# Patient Record
Sex: Female | Born: 1970 | Hispanic: No | Marital: Married | Smoking: Never smoker
Health system: Southern US, Community
[De-identification: ages and names within clinical notes are randomized; demographics above are authoritative.]

## PROBLEM LIST (undated history)

## (undated) DIAGNOSIS — E039 Hypothyroidism, unspecified: Secondary | ICD-10-CM

## (undated) HISTORY — PX: BACK SURGERY: SHX140

## (undated) HISTORY — DX: Hypothyroidism, unspecified: E03.9

## (undated) HISTORY — PX: HERNIA REPAIR: SHX51

---

## 1999-11-27 ENCOUNTER — Inpatient Hospital Stay (HOSPITAL_COMMUNITY): Admission: RE | Admit: 1999-11-27 | Discharge: 1999-11-29 | Payer: Self-pay | Admitting: Orthopedic Surgery

## 1999-11-27 ENCOUNTER — Encounter: Payer: Self-pay | Admitting: Orthopedic Surgery

## 2005-08-28 ENCOUNTER — Encounter: Admission: RE | Admit: 2005-08-28 | Discharge: 2005-08-28 | Payer: Self-pay | Admitting: Otolaryngology

## 2008-07-05 ENCOUNTER — Ambulatory Visit: Payer: Self-pay | Admitting: Family Medicine

## 2008-07-05 DIAGNOSIS — E039 Hypothyroidism, unspecified: Secondary | ICD-10-CM | POA: Insufficient documentation

## 2008-07-05 LAB — CONVERTED CEMR LAB
Inflenza A Ag: NEGATIVE
Influenza B Ag: NEGATIVE
Rapid Strep: NEGATIVE

## 2011-02-07 NOTE — Op Note (Signed)
[place]  Patient:    Morgan Henderson, Morgan Henderson                        MRN: [id number] Proc. Date: 11/27/99 Adm. Date:  [id number] Attending:  Drema Pry CC:         Jearld Adjutant, M.D.                           Operative Report  PREOPERATIVE DIAGNOSIS:  Herniated nucleus pulposus L5-S1 left with extruded fragment and disk bulge right.  POSTOPERATIVE DIAGNOSIS:  Herniated nucleus pulposus L5-S1 left with extruded fragment and disk bulge right.  PROCEDURE:  Hemilaminotomy L5-S1 bilateral left and right with disk excision, S1 nerve root decompression and excision of extruded fragment left.  SURGEON:  Jearld Adjutant, M.D.  ASSISTANTClide Cliff D. Gasper Sells, M.D.  ANESTHESIA:  General endotracheal.  CULTURES:  None.  DRAINS:  None.  ESTIMATED BLOOD LOSS:  200 cc.  REPLACEMENT:  Without.  PATHOLOGIC FINDINGS/HISTORY:  Morgan Henderson is a 40 year old female, who first presented to [place] July 03, 1999 with a one year history of on and off back pain.  It has  bothered her with the beginning of the year with a lot of lifting.  She also has two young children, who like to be carried a lot.  The pain initially radiated own her right leg toward her calf.  She has seen a chiropractor, who felt she might  have a disk bulge.  We evaluated her and felt there was some disk space narrowing at L5-S1, but first thought she had lumbar HNP centrally and to the right.  We obtained a MRI scan at that point and after treating her with a Sterapred dose ack and what it showed was a central degenerative disk bulge at 4-5 and 5-1 with a large central HNP at L5-S1 extending centrally and to the right improving clinically.  Ultimately, the patient calmed down her pain.  We got her ______ support and she was 90% improved overall.  She came back November 18, 1999 with  severe low-back pain, brought her to her knees.  The pain was radiating down her left leg into her  calf and ankles, severe to the point, where she could hardly walk.  She denied bladder or bowel dysfunction.  We examined her and we thought she had an acute herniated disk with mild left EHL weakness.  Straight leg raising positive on the right at 70, left 45.  We put her on another dose pack, got another MRI scan.  At this point, when she was medicated, she was feeling 5-7 level pain, mostly left leg pain, some right.  MRI scan showed a large extruded disk fragment L5-S1 down the left side of the body extended in the neural foramina, as well as, a mild central disk bulge at 4-5.  There was also some bulge on the right side at  L5-S1.  We felt at this point, given her overall history, she deserved a bilateral decompression with removal of the large fragment on the left.  At surgery, we did indeed find this huge, large fragment in two large chunks, which we removed. These chunks measured at least 2 x 1 x 1 cm the large chunk and the other 1 x 1 cm. n the right side, there was not as much disk material, but there was one 1 x 1 fragment that we  removed from that side.  The disk space was relatively clear.  There was some endplate that we scraped and removed.  Foraminotomies were carried out and the nerve roots were clear at closure.  We had a small 5 mm bleb on the  nerve root partial dural rent, but under direct vision with loupe magnification, it did not leak on several Valsalva maneuvers.  Therefore, we felt it was partial thickness and we did not repair it, but did put Surgicel on it with fibrin glue and I think this will not be a problem.  Her right side was completely decompressed, as well as, the left.  PROCEDURE:  With adequate anesthesia obtained using endotracheal technique, 1 g  Ancef given IV prophylaxis.  The patient was placed in the prone position on chest rolls in a flexed position.  We then placed a localizing spinal needle after sterile prep at L5-S1.  X-ray  were taken confirming positioning.  Standard prepping and draping was then carried out and an incision made from L5 to S1.  The incision was deepened sharply with the knife and hemostasis obtained using the Bovie electrocoagulator.   Loupe magnification and illumination was used with 3.5 loupe. Dissection was carried down to the lumbar fascia.  Bleeding points were cauterized with the Bovie and retractors placed.  I then incised the lumbar fascia at L5-S1 on both sides of the spinous processes and removed or spread the musculature off the lamina.  Deep retractors were then placed and further soft tissues were dissected. Then on the left side, I entered with superior laminotomy with a #4 Penfield. Placed the Eldora 4 in place, got and x-ray, localizing [place] to L5-S1.  We first started on the right side hemilaminotomy, where a laminotomy was carried out with Kerrison punch after thinning the lamina with the bur and removal of the ligamentum flavum and exposure of the dura and nerve root.  The nerve root was moved medially with a nerve root retractor.  We then spread over the disk space, incised the disk space and removed disk with pituitaries and angled curets and Epstein down curets. When we were satisfied with decompression on that side, we turned to the left side, did the exact same laminectomy and ligamentum flavum excision.  We first then saw the extruded disk fragment, teased the nerve root over and then teased the main  portion of the largest fragment out and then a second fragment.  We then felt above and below the nerve root, to make sure we did not push any more disk material down and there was none.  We retracted the nerve root, exposed the disk space and evacuated the disk in a similar fashion.  We then checked above and below with  hockey stick and underneath the disk space and there was no additional disk material.  We then noted the small bleb on the nerve root,  did the Valsalva maneuver several times to 30 cmH2O.  We then placed Surgicel and fibrin glue over top after I had placed 800 mg of fentanyl for 90 seconds on the nerve root.  Gelfoam was placed on the other side with a small amount of Surgicel and hemostasis was obtained in the hemilaminotomies bilaterally with the nerve roots freed on oth sides and the disk material removed.  We then placed 0.5% Marcaine in and about the lumbar without epinephrine wound and musculature.  Further irrigation was carried out and the wound was closed in layers with #1 Vicryl on  the lumbar fascia and -0 and 3-0 Vicryl on the subcu, and skin staples.  A sterile compressive dressing as applied and the patient, having tolerated the procedure well, was turned over to the recovery room stretch and taken to the recovery room in satisfactory condition for routine postoperative care. DD:  11/27/99 TD:  11/28/99 Job: [id number]

## 2011-07-08 ENCOUNTER — Encounter: Payer: Self-pay | Admitting: Emergency Medicine

## 2011-07-08 ENCOUNTER — Inpatient Hospital Stay (INDEPENDENT_AMBULATORY_CARE_PROVIDER_SITE_OTHER)
Admission: RE | Admit: 2011-07-08 | Discharge: 2011-07-08 | Disposition: A | Discharge status: Home / Self Care | Payer: BC Managed Care – PPO | Source: Ambulatory Visit | Attending: Emergency Medicine | Admitting: Emergency Medicine

## 2011-07-08 ENCOUNTER — Other Ambulatory Visit: Payer: Self-pay | Admitting: Emergency Medicine

## 2011-07-08 ENCOUNTER — Ambulatory Visit
Admission: RE | Admit: 2011-07-08 | Discharge: 2011-07-08 | Disposition: A | Discharge status: Home / Self Care | Payer: BC Managed Care – PPO | Source: Ambulatory Visit | Attending: Emergency Medicine | Admitting: Emergency Medicine

## 2011-07-08 DIAGNOSIS — M722 Plantar fascial fibromatosis: Secondary | ICD-10-CM | POA: Insufficient documentation

## 2011-07-08 DIAGNOSIS — R52 Pain, unspecified: Secondary | ICD-10-CM

## 2011-07-08 DIAGNOSIS — J309 Allergic rhinitis, unspecified: Secondary | ICD-10-CM

## 2011-07-10 ENCOUNTER — Ambulatory Visit: Payer: BC Managed Care – PPO | Attending: Emergency Medicine | Admitting: Physical Therapy

## 2011-07-10 DIAGNOSIS — M6281 Muscle weakness (generalized): Secondary | ICD-10-CM | POA: Insufficient documentation

## 2011-07-10 DIAGNOSIS — M25579 Pain in unspecified ankle and joints of unspecified foot: Secondary | ICD-10-CM | POA: Insufficient documentation

## 2011-07-10 DIAGNOSIS — IMO0001 Reserved for inherently not codable concepts without codable children: Secondary | ICD-10-CM | POA: Insufficient documentation

## 2011-07-14 ENCOUNTER — Ambulatory Visit: Payer: BC Managed Care – PPO | Admitting: Physical Therapy

## 2011-07-16 ENCOUNTER — Ambulatory Visit: Payer: BC Managed Care – PPO | Admitting: Physical Therapy

## 2011-07-18 ENCOUNTER — Ambulatory Visit: Payer: BC Managed Care – PPO | Admitting: Physical Therapy

## 2011-07-25 ENCOUNTER — Encounter: Payer: Self-pay | Admitting: Family Medicine

## 2011-07-25 ENCOUNTER — Ambulatory Visit (INDEPENDENT_AMBULATORY_CARE_PROVIDER_SITE_OTHER): Payer: BC Managed Care – PPO | Admitting: Family Medicine

## 2011-07-25 DIAGNOSIS — R269 Unspecified abnormalities of gait and mobility: Secondary | ICD-10-CM

## 2011-07-25 DIAGNOSIS — M79609 Pain in unspecified limb: Secondary | ICD-10-CM

## 2011-07-25 DIAGNOSIS — M722 Plantar fascial fibromatosis: Secondary | ICD-10-CM | POA: Insufficient documentation

## 2011-07-25 DIAGNOSIS — M2140 Flat foot [pes planus] (acquired), unspecified foot: Secondary | ICD-10-CM | POA: Insufficient documentation

## 2011-07-25 DIAGNOSIS — M79673 Pain in unspecified foot: Secondary | ICD-10-CM | POA: Insufficient documentation

## 2011-07-25 NOTE — Patient Instructions (Signed)
You have plantar fasciitis Take tylenol or aleve as needed for pain  Plantar fascia stretch for 20-30 seconds (do 3 of these) in morning Lowering/raise on a step exercises 3 x 15 once or twice a day - this is very important for long term recovery. Can add heel walks, toe walks forward and backward as well Ice bucket 10-15 minutes at end of day Avoid flat shoes/barefoot walking as much as possible. Arch straps have been shown to help with pain. Heel lifts may help with pain by avoiding fully stretching the plantar fascia except when doing home exercises. Orthotics may be helpful. Steroid injection is a consideration for short term pain relief if you are struggling. Physical therapy is also an option. >90% improve by 12 months with or without treatment but the above things improve the condition faster.

## 2011-07-25 NOTE — Progress Notes (Signed)
  Subjective:    Patient ID: Morgan Henderson, female    DOB: 06-11-1971, 40 y.o.   MRN: [id number]  PCP: Dr. Lissa Morales  HPI 40 yo F here for bilateral heel pain.  Patient has prior history of plantar fasciitis dating back to 5 years ago. It improved with cortisone injections, stretches but seems to have recurred over past month. Has been doing stretches, icing, massage, using ankle brace, physical therapy and iontophoresis. Has been recommended that she come here to try shoe inserts. Not done cortisone injections for this round of pain. Is plantar on right worse than left heels. No other pain No bruising or swelling No known injury or offending event. Tried different shoes - newest ones feel the best.  Past Medical History  Diagnosis Date  . Hypothyroidism     No current outpatient prescriptions on file prior to visit.    Past Surgical History  Procedure Date  . Back surgery   . Hernia repair     No Known Allergies  History   Social History  . Marital Status: Single    Spouse Name: N/A    Number of Children: N/A  . Years of Education: N/A   Occupational History  . Not on file.   Social History Main Topics  . Smoking status: Never Smoker   . Smokeless tobacco: Not on file  . Alcohol Use: Not on file  . Drug Use: Not on file  . Sexually Active: Not on file   Other Topics Concern  . Not on file   Social History Narrative  . No narrative on file    Family History  Problem Relation Age of Onset  . Hyperlipidemia Father   . Hypertension Father   . Heart attack Maternal Grandfather   . Heart attack Paternal Grandmother   . Sudden death Neg Hx   . Diabetes Neg Hx     BP 138/83  Pulse 67  Temp(Src) 97.8 F (36.6 C) (Oral)  Ht 5\' 4"  (1.626 m)  Wt 160 lb (72.576 kg)  BMI 27.46 kg/m2  LMP 06/24/2011  Review of Systems See HPI above.    Objective:   Physical Exam Gen: NAD  Bilateral feet: Left pes planus, right only mild overpronation - greater  preservation than left. No leg length inequality. No hallux rigidus or hallux valgus. No swelling, bruising, other deformity. TTP R > L plantar posteromedial aspect of plantar fascia at insertion.  No achilles or other TTP about foot or ankle. FROM with 5/5 strength all directions of ankle. Negative ant drawer and talar tilt. NVI distally.    Assessment & Plan:  1. Bilateral plantar fasciitis - Shown home rehab protocol and stretches.  Continue with PT and iontophoresis.  Has arch straps at home - encouraged to use these.  Given sports insoles with scaphoid pads (small on left, medium on right) - felt comfortable in the office - advised to wear regularly in shoes when up and walking around.  Icing, tylenol/nsaids as needed, given an extra pair of scaphoid pads and heel lifts to use in other shoes.  F/u in 1 month. If doing well with these insoles will either show her how to order these from [employer] or make her custom orthotics.

## 2011-07-25 NOTE — Assessment & Plan Note (Signed)
Bilateral plantar fasciitis - Shown home rehab protocol and stretches.  Continue with PT and iontophoresis.  Has arch straps at home - encouraged to use these.  Given sports insoles with scaphoid pads (small on left, medium on right) - felt comfortable in the office - advised to wear regularly in shoes when up and walking around.  Icing, tylenol/nsaids as needed.  F/u in 1 month. If doing well with these insoles will either show her how to order these from [employer] or make her custom orthotics.

## 2011-08-22 ENCOUNTER — Ambulatory Visit (INDEPENDENT_AMBULATORY_CARE_PROVIDER_SITE_OTHER): Payer: BC Managed Care – PPO | Admitting: Family Medicine

## 2011-08-22 ENCOUNTER — Encounter: Payer: Self-pay | Admitting: Family Medicine

## 2011-08-22 VITALS — BP 119/79 | HR 64 | Temp 98.2°F | Ht 64.0 in | Wt 160.0 lb

## 2011-08-22 DIAGNOSIS — M722 Plantar fascial fibromatosis: Secondary | ICD-10-CM

## 2011-08-22 NOTE — Assessment & Plan Note (Signed)
Left side has resolved and right side only intermittent at this time, worse with a lot of activity.  Change from medium to a small scaphoid pad with sports insoles - felt better in office with this but will have to try through whole day to see if this makes a difference.  Continue home exercises.  Showed her how to order insoles/pads from [employer].  Consider injection and/or custom orthotics if not continuing to improve.

## 2011-08-22 NOTE — Progress Notes (Signed)
  Subjective:    Patient ID: Morgan Henderson, female    DOB: 1971/08/06, 40 y.o.   MRN: [id number]  PCP: Dr. Lissa Morales  HPI  40 yo F here for f/u bilateral heel pain.  11/2: Patient has prior history of plantar fasciitis dating back to 5 years ago. It improved with cortisone injections, stretches but seems to have recurred over past month. Has been doing stretches, icing, massage, using ankle brace, physical therapy and iontophoresis. Has been recommended that she come here to try shoe inserts. Not done cortisone injections for this round of pain. Is plantar on right worse than left heels. No other pain No bruising or swelling No known injury or offending event. Tried different shoes - newest ones feel the best.  11/30: Patient reports overall she has improved. Pain is now intermittent, only in right heel. Has been icing, using arch straps. Using sports insoles with scaphoid pads (right medium, left small) but right foot hurts if she wears these all day - switches to arch strap with small scaphoid pad and feels better. Doing home exercise program as well.  Past Medical History  Diagnosis Date  . Hypothyroidism     Current Outpatient Prescriptions on File Prior to Visit  Medication Sig Dispense Refill  . levothyroxine (SYNTHROID, LEVOTHROID) 100 MCG tablet Take 100 mcg by mouth daily.          Past Surgical History  Procedure Date  . Back surgery   . Hernia repair     No Known Allergies  History   Social History  . Marital Status: Single    Spouse Name: N/A    Number of Children: N/A  . Years of Education: N/A   Occupational History  . Not on file.   Social History Main Topics  . Smoking status: Never Smoker   . Smokeless tobacco: Not on file  . Alcohol Use: Not on file  . Drug Use: Not on file  . Sexually Active: Not on file   Other Topics Concern  . Not on file   Social History Narrative  . No narrative on file    Family History  Problem Relation Age  of Onset  . Hyperlipidemia Father   . Hypertension Father   . Heart attack Maternal Grandfather   . Heart attack Paternal Grandmother   . Sudden death Neg Hx   . Diabetes Neg Hx     BP 119/79  Pulse 64  Temp(Src) 98.2 F (36.8 C) (Oral)  Ht 5\' 4"  (1.626 m)  Wt 160 lb (72.576 kg)  BMI 27.46 kg/m2  Review of Systems  See HPI above.    Objective:   Physical Exam  Gen: NAD  Right foot: No hallux rigidus or hallux valgus. No swelling, bruising, other deformity. Mild TTP R plantar posteromedial aspect of plantar fascia at insertion.  No achilles or other TTP about foot or ankle. FROM with 5/5 strength all directions of ankle. Negative ant drawer and talar tilt. NVI distally.    Assessment & Plan:  1. Bilateral plantar fasciitis - Left side has resolved and right side only intermittent at this time, worse with a lot of activity.  Change from medium to a small scaphoid pad with sports insoles - felt better in office with this but will have to try through whole day to see if this makes a difference.  Continue home exercises.  Showed her how to order insoles/pads from [employer].  Consider injection and/or custom orthotics if not continuing to improve.

## 2011-08-25 ENCOUNTER — Ambulatory Visit: Payer: BC Managed Care – PPO | Admitting: Family Medicine

## 2011-08-25 NOTE — Progress Notes (Signed)
Summary: Chest cold/feet pain Room 5   Vital Signs:  Patient Profile:   40 Years Old Female CC:      Dry cough x 3 weeks Rt foot pain x 1 mth Height:     63.75 inches Weight:      162 pounds O2 Sat:      100 % O2 treatment:    Room Air Temp:     98.2 degrees F oral Pulse rate:   66 / minute Pulse rhythm:   regular Resp:     18 per minute BP sitting:   113 / 64  (left arm) Cuff size:   regular  Vitals Entered By: Emilio Math (July 08, 2011 12:02 PM)                  Current Allergies: No known allergies History of Present Illness Chief Complaint: Dry cough x 3 weeks Rt foot pain x 1 mth History of Present Illness: 48) 40 Years Old Female complains of onset of cold symptoms for a few weeks.  Twanna has been using nothing OTC but does have Singulair that she takes in the springtime for her allergies.  + nighttime and morning dry cough. No sore throat No pleuritic pain No wheezing No nasal congestion No sinus pain/pressure No chest congestion No itchy/red eyes No earache No hemoptysis No SOB No chills/sweats No fever No nausea No vomiting No abdominal pain No diarrhea No skin rashes No fatigue No myalgias No headache   2) Also c/o bilateral heel pain for a month.  Has a history of plantar fasciitis.  Has tried ice, stretching.  Worse in the morning and with certain activities.  Has tried steroid injection in the past which worked.  No swelling, no bruising.  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  Current Meds SYNTHROID 25 MCG TABS (LEVOTHYROXINE SODIUM) one qd  REVIEW OF SYSTEMS Constitutional Symptoms      Denies fever, chills, night sweats, weight loss, weight gain, and fatigue.  Eyes       Denies change in vision, eye pain, eye discharge, glasses, contact lenses, and eye surgery. Ear/Nose/Throat/Mouth       Complains of hoarseness.      Denies hearing loss/aids, change in hearing, ear pain, ear  discharge, dizziness, frequent runny nose, frequent nose bleeds, sinus problems, sore throat, and tooth pain or bleeding.  Respiratory       Complains of dry cough.      Denies productive cough, wheezing, shortness of breath, asthma, bronchitis, and emphysema/COPD.  Cardiovascular       Denies murmurs, chest pain, and tires easily with exhertion.    Gastrointestinal       Denies stomach pain, nausea/vomiting, diarrhea, constipation, blood in bowel movements, and indigestion. Genitourniary       Denies painful urination, kidney stones, and loss of urinary control. Neurological       Denies paralysis, seizures, and fainting/blackouts. Musculoskeletal       Complains of muscle pain and decreased range of motion.      Denies joint pain, joint stiffness, redness, swelling, muscle weakness, and gout.  Skin       Denies bruising, unusual mles/lumps or sores, and hair/skin or nail changes.  Psych       Denies mood changes, temper/anger issues, anxiety/stress, speech problems, depression, and sleep problems.  Past History:  Past Medical History: Reviewed history from 07/05/2008 and no changes required.  hypothyroidism  Past Surgical History: Repair disc Inguinal herniorrhaphy  Social History: Reviewed history from 07/05/2008 and no changes required. Married Regular exercise-yes Never Smoked Physical Exam General appearance: well developed, well nourished, no acute distress Ears: normal, no lesions or deformities Nasal: mucosa pink, nonedematous, no septal deviation, turbinates normal Oral/Pharynx: clear PND, no erythema, no exudate Chest/Lungs: no rales, wheezes, or rhonchi bilateral, breath sounds equal without effort Heart: regular rate and  rhythm, no murmur MSE: oriented to time, place, and person Bilateral heels (R>L): TTP at medial inferior calcaneus, no bruising, no swelling, FROM ankle Assessment New Problems: ALLERGIC RHINITIS (ICD-477.9) PLANTAR FASCIITIS  (ICD-728.71)   Plan New Orders: Est. Patient Level III [16109] Planning Comments:   1)  I believe this is mostly allergic rhinitis. Should get back on her Singulair and take daily antihistamine for a few weeks. Can use OTC cough meds as needed.  Rest voice, hydrate. 2) Bilateral Xrays show calcaneal spurring.   Discussed treatment options for platar fasciitis.  Continue icing, stretching.  Will send to PT to discuss stretches, ionto.  Will give card to Dr. Pearletha Forge to discuss orthotics, injection, further treatment.  Due to her job, I would treat this fairly aggressively.  She also has straps at home.  Gave her an air heel brace to wear to see if that helps.   The patient and/or caregiver has been counseled thoroughly with regard to medications prescribed including dosage, schedule, interactions, rationale for use, and possible side effects and they verbalize understanding.  Diagnoses and expected course of recovery discussed and will return if not improved as expected or if the condition worsens. Patient and/or caregiver verbalized understanding.   Orders Added: 1)  Est. Patient Level III [60454]

## 2012-01-06 ENCOUNTER — Encounter: Payer: Self-pay | Admitting: Family Medicine

## 2012-01-06 ENCOUNTER — Other Ambulatory Visit: Payer: Self-pay | Admitting: Family Medicine

## 2012-01-06 ENCOUNTER — Ambulatory Visit (INDEPENDENT_AMBULATORY_CARE_PROVIDER_SITE_OTHER): Payer: BC Managed Care – PPO | Admitting: Family Medicine

## 2012-01-06 ENCOUNTER — Ambulatory Visit (HOSPITAL_BASED_OUTPATIENT_CLINIC_OR_DEPARTMENT_OTHER)
Admission: RE | Admit: 2012-01-06 | Discharge: 2012-01-06 | Disposition: A | Discharge status: Home / Self Care | Payer: BC Managed Care – PPO | Source: Ambulatory Visit | Attending: Family Medicine | Admitting: Family Medicine

## 2012-01-06 VITALS — BP 130/84 | HR 64 | Temp 97.9°F | Ht 64.0 in | Wt 160.0 lb

## 2012-01-06 DIAGNOSIS — M25552 Pain in left hip: Secondary | ICD-10-CM | POA: Insufficient documentation

## 2012-01-06 DIAGNOSIS — M25559 Pain in unspecified hip: Secondary | ICD-10-CM | POA: Insufficient documentation

## 2012-01-06 DIAGNOSIS — M25519 Pain in unspecified shoulder: Secondary | ICD-10-CM

## 2012-01-06 DIAGNOSIS — M25511 Pain in right shoulder: Secondary | ICD-10-CM | POA: Insufficient documentation

## 2012-01-06 NOTE — Assessment & Plan Note (Signed)
her exam indicates this is intraarticular in nature, not from low back, IT band or other back/leg pathology.  Concerning for labral tear.  Radiographs negative for DJD.  Discussed options given this is impacting her ability to sleep, sit comfortably.  She would like to try intraarticular cortisone injection over MR arthrogram first.  Will order this for diagnostic and therapeutic purposes.  Advised if after 1-2 weeks she does not have great improvement with this, to call me to go over next steps which would likely be MR arthrogram unless character/location of pain changes.

## 2012-01-06 NOTE — Assessment & Plan Note (Signed)
Right biceps tendinopathy - she will discuss this with her PT friends and start rehab program.  Reassured.  Discussed relative rest, icing, nsaids.

## 2012-01-06 NOTE — Progress Notes (Addendum)
Subjective:    Patient ID: Morgan Henderson, female    DOB: [birth year], 41 y.o.   MRN: [id number]  PCP: Dr. Lissa Morales  HPI 41 yo F here for left hip pain, right shoulder pain.  1. Left hip pain Patient denies known injury. She reports she has had anterior left hip pain for several months. Thought this was compensatory from her right plantar fasciitis but this has persisted. Impairing sleep unless she has left leg in internal rotation. Pain goes down anterolateral left thigh sometimes into lower leg. No swelling, bruising. Has tried working with PTs and they can't figure out what the issue is, not getting better with NSAIDs and home exercise program. Actually feels better when exercising.  Worse with prolonged sitting and sleep with external rotation.  2. Right shoulder pain Slowly worsening anterior shoulder pain worse with elbow flexion, hitting volleyball underhand. Not hurting at rest. Has not tried anything for this (taking nsaid for hip). No neck pain, numbness/tingling.  Past Medical History  Diagnosis Date  . Hypothyroidism     Current Outpatient Prescriptions on File Prior to Visit  Medication Sig Dispense Refill  . levothyroxine (SYNTHROID, LEVOTHROID) 100 MCG tablet Take 100 mcg by mouth daily.          Past Surgical History  Procedure Date  . Back surgery   . Hernia repair     No Known Allergies  History   Social History  . Marital Status: Single    Spouse Name: N/A    Number of Children: N/A  . Years of Education: N/A   Occupational History  . Not on file.   Social History Main Topics  . Smoking status: Never Smoker   . Smokeless tobacco: Not on file  . Alcohol Use: Not on file  . Drug Use: Not on file  . Sexually Active: Not on file   Other Topics Concern  . Not on file   Social History Narrative  . No narrative on file    Family History  Problem Relation Age of Onset  . Hyperlipidemia Father   . Hypertension Father   . Heart attack  Maternal Grandfather   . Heart attack Paternal Grandmother   . Sudden death Neg Hx   . Diabetes Neg Hx     BP 130/84  Pulse 64  Temp(Src) 97.9 F (36.6 C) (Oral)  Ht 5\' 4"  (1.626 m)  Wt 160 lb (72.576 kg)  BMI 27.46 kg/m2  LMP 12/23/2011  Review of Systems See HPI above.    Objective:   Physical Exam Gen: NAD  1. Left hip/back: No gross deformity, scoliosis. No back TTP.  No greater trochanter, iliopsoas TTP (pain deeper anteriorly). FROM with pain on external rotation and fabers deep anteriorly. Strength LEs 5/5 all muscle groups.   2+ MSRs in patellar and achilles tendons, equal bilaterally. Negative SLRs. Sensation intact to light touch bilaterally. Positive logroll on left, negative right.  R shoulder: No swelling, ecchymoses.  No gross deformity. TTP biceps tendon.  No other TTP. FROM with negative painful arc. Negative Hawkins, Neers. Positive yergasons, negative Speeds. Strength 5/5 with empty can and resisted internal/external rotation. Negative apprehension. NV intact distally.    Assessment & Plan:  1. Left hip pain - her exam indicates this is intraarticular in nature, not from low back, IT band or other back/leg pathology.  Concerning for labral tear.  Radiographs negative for DJD.  Discussed options given this is impacting her ability to sleep, sit comfortably.  She would  like to try intraarticular cortisone injection over MR arthrogram first.  Will order this for diagnostic and therapeutic purposes.  Advised if after 1-2 weeks she does not have great improvement with this, to call me to go over next steps which would likely be MR arthrogram unless character/location of pain changes.    2. Right biceps tendinopathy - she will discuss this with her PT friends and start rehab program.  Reassured.  Discussed relative rest, icing, nsaids.  Addendum 5/23:  Patient called stating her hip injection provided 2 days of relief but pain recurred after this and has  returned to pre-injection levels.  Pain still localized to groin anteriorly and worse with logroll maneuver per patient.  As we discussed, will move forward with MR arthrogram of left hip to assess for labral tear, femoroacetabular impingement.  Addendum: Spoke with patient regarding MRI results - no evidence of labral tear or FAI.  We discussed the main two possibilities as a result of these findings: 1. Lumbar pathology (though her exam greatly favors hip pathology) 2. Snapping hip syndrome.  She wants to try working with physical therapists she knows on stretching and strengthening around hip joint and I agree with this approach.  If still not improving after 6 weeks advised to follow-up and would consider lumbar imaging after reevaluation.

## 2012-01-08 ENCOUNTER — Ambulatory Visit
Admission: RE | Admit: 2012-01-08 | Discharge: 2012-01-08 | Disposition: A | Discharge status: Home / Self Care | Payer: BC Managed Care – PPO | Source: Ambulatory Visit | Attending: Family Medicine | Admitting: Family Medicine

## 2012-01-08 DIAGNOSIS — M25552 Pain in left hip: Secondary | ICD-10-CM

## 2012-01-08 MED ORDER — IOHEXOL 180 MG/ML  SOLN
1.0000 mL | Freq: Once | INTRAMUSCULAR | Status: AC | PRN
  Administered 2012-01-08: 1 mL via INTRA_ARTICULAR

## 2012-01-08 MED ORDER — METHYLPREDNISOLONE ACETATE 40 MG/ML INJ SUSP (RADIOLOG
120.0000 mg | Freq: Once | INTRAMUSCULAR | Status: AC
  Administered 2012-01-08: 120 mg via INTRA_ARTICULAR

## 2012-02-13 NOTE — Progress Notes (Signed)
Addended by: Lenda Kelp on: 02/13/2012 09:46 AM   Modules accepted: Orders

## 2012-02-23 ENCOUNTER — Ambulatory Visit
Admission: RE | Admit: 2012-02-23 | Discharge: 2012-02-23 | Disposition: A | Discharge status: Home / Self Care | Payer: BC Managed Care – PPO | Source: Ambulatory Visit | Attending: Family Medicine | Admitting: Family Medicine

## 2012-02-23 DIAGNOSIS — M25552 Pain in left hip: Secondary | ICD-10-CM

## 2012-02-23 MED ORDER — IOHEXOL 180 MG/ML  SOLN
15.0000 mL | Freq: Once | INTRAMUSCULAR | Status: AC | PRN
  Administered 2012-02-23: 15 mL via INTRA_ARTICULAR

## 2014-02-06 ENCOUNTER — Encounter: Payer: Self-pay | Admitting: Family Medicine

## 2014-02-06 ENCOUNTER — Ambulatory Visit (INDEPENDENT_AMBULATORY_CARE_PROVIDER_SITE_OTHER): Payer: BC Managed Care – PPO | Admitting: Family Medicine

## 2014-02-06 VITALS — BP 126/81 | HR 73 | Ht 64.0 in | Wt 160.0 lb

## 2014-02-06 DIAGNOSIS — M549 Dorsalgia, unspecified: Secondary | ICD-10-CM

## 2014-02-06 DIAGNOSIS — M545 Low back pain, unspecified: Secondary | ICD-10-CM

## 2014-02-06 DIAGNOSIS — M79605 Pain in left leg: Principal | ICD-10-CM

## 2014-02-07 ENCOUNTER — Ambulatory Visit (HOSPITAL_BASED_OUTPATIENT_CLINIC_OR_DEPARTMENT_OTHER)
Admission: RE | Admit: 2014-02-07 | Discharge: 2014-02-07 | Disposition: A | Discharge status: Home / Self Care | Payer: BC Managed Care – PPO | Source: Ambulatory Visit | Attending: Family Medicine | Admitting: Family Medicine

## 2014-02-07 ENCOUNTER — Encounter: Payer: Self-pay | Admitting: Family Medicine

## 2014-02-07 DIAGNOSIS — M545 Low back pain, unspecified: Secondary | ICD-10-CM | POA: Insufficient documentation

## 2014-02-07 DIAGNOSIS — M5126 Other intervertebral disc displacement, lumbar region: Secondary | ICD-10-CM | POA: Insufficient documentation

## 2014-02-07 DIAGNOSIS — M48061 Spinal stenosis, lumbar region without neurogenic claudication: Secondary | ICD-10-CM | POA: Insufficient documentation

## 2014-02-07 DIAGNOSIS — M47817 Spondylosis without myelopathy or radiculopathy, lumbosacral region: Secondary | ICD-10-CM | POA: Insufficient documentation

## 2014-02-07 DIAGNOSIS — M549 Dorsalgia, unspecified: Secondary | ICD-10-CM | POA: Insufficient documentation

## 2014-02-07 DIAGNOSIS — M79605 Pain in left leg: Secondary | ICD-10-CM

## 2014-02-07 NOTE — Progress Notes (Addendum)
Patient ID: Morgan Henderson, female   DOB: 1971/04/10, 43 y.o.   MRN: [id number]  PCP: Dr. Lissa MoralesNnadi   43 yo F here for left hip and back pain  01/06/12: Patient denies known injury.  She reports she has had anterior left hip pain for several months.  Thought this was compensatory from her right plantar fasciitis but this has persisted.  Impairing sleep unless she has left leg in internal rotation.  Pain goes down anterolateral left thigh sometimes into lower leg.  No swelling, bruising.  Has tried working with PTs and they can't figure out what the issue is, not getting better with NSAIDs and home exercise program.  Actually feels better when exercising. Worse with prolonged sitting and sleep with external rotation.   02/06/14: Patient reports she had improved from last visit with left hip pain. She had done physical therapy with dry needling, laser and home exercise program for it to resolve. On Mother's Day her back went out getting out of bed. Has been taking muscle relaxants, pain meds. Pain worse by end of day. Associated with pains shooting into left leg in different spots. Tingling as well. No right sided issues. Difficulty sleeping - worse at end of day. Remotely in 2000 she had L5 disc removed per her report. No bowel/bladder dysfunction.  Past Medical History  Diagnosis Date  . Hypothyroidism     Current Outpatient Prescriptions on File Prior to Visit  Medication Sig Dispense Refill  . levothyroxine (SYNTHROID, LEVOTHROID) 100 MCG tablet Take 100 mcg by mouth daily.         No current facility-administered medications on file prior to visit.    Past Surgical History  Procedure Laterality Date  . Back surgery    . Hernia repair      No Known Allergies  History   Social History  . Marital Status: Married    Spouse Name: N/A    Number of Children: N/A  . Years of Education: N/A   Occupational History  . Not on file.   Social History Main Topics  . Smoking  status: Never Smoker   . Smokeless tobacco: Not on file  . Alcohol Use: Not on file  . Drug Use: Not on file  . Sexual Activity: Not on file   Other Topics Concern  . Not on file   Social History Narrative  . No narrative on file    Family History  Problem Relation Age of Onset  . Hyperlipidemia Father   . Hypertension Father   . Heart attack Maternal Grandfather   . Heart attack Paternal Grandmother   . Sudden death Neg Hx   . Diabetes Neg Hx     BP 126/81  Pulse 73  Ht 5\' 4"  (1.626 m)  Wt 160 lb (72.576 kg)  BMI 27.45 kg/m2  Objective:   Physical Exam   Gen: NAD   Left hip/back:  No gross deformity, scoliosis. TTP L > R paraspinal lumbar region.  No midline or bony TTP. FROM with pain on flexion. Strength LEs 5/5 all muscle groups. 2+ MSRs in patellar and achilles tendons, equal bilaterally. Negative SLRs. Sensation intact to light touch bilaterally.  Negative logroll bilateral hips Negative fabers and piriformis stretches.   Assessment & Plan:   1. 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  Discussed options - will go ahead with MRI of lumbar spine to further assess.  Consider PT, prednisone, ESIs,  referral to neurosurgery depending on results.  Of note she is active in [facility] and has been since hip issues over 2 years ago.  Addendum:  MRI reviewed and discussed with patient.  Patient does have edema at L4 and L5 but only mild foraminal narrowing on the left.  Disc bulges other levels are to the right.  I'm not certain injections would be beneficial to her - would like to set her up with Dr. Maurice SmallIbazebo for an evaluation in light of these findings.

## 2014-02-07 NOTE — Assessment & Plan Note (Signed)
presentation currently different from prior snapping hip syndrome.  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  Discussed options - will go ahead with MRI of lumbar spine to further assess.  Consider PT, prednisone, ESIs, referral to neurosurgery depending on results.  Of note she is active in [facility] and has been since hip issues over 2 years ago.

## 2015-05-30 ENCOUNTER — Ambulatory Visit: Payer: Self-pay | Admitting: Family Medicine

## 2015-06-05 ENCOUNTER — Encounter: Payer: Self-pay | Admitting: Family Medicine

## 2015-06-05 ENCOUNTER — Ambulatory Visit (INDEPENDENT_AMBULATORY_CARE_PROVIDER_SITE_OTHER): Payer: BLUE CROSS/BLUE SHIELD | Admitting: Family Medicine

## 2015-06-05 VITALS — BP 115/75 | HR 76 | Ht 64.0 in | Wt 153.0 lb

## 2015-06-05 DIAGNOSIS — M25561 Pain in right knee: Secondary | ICD-10-CM

## 2015-06-05 DIAGNOSIS — S83241A Other tear of medial meniscus, current injury, right knee, initial encounter: Secondary | ICD-10-CM

## 2015-06-07 DIAGNOSIS — M25561 Pain in right knee: Secondary | ICD-10-CM | POA: Insufficient documentation

## 2015-06-07 NOTE — Progress Notes (Signed)
PCP: NNADI,VICTORIA, MD  Subjective:   HPI: Patient is a 44 y.o. female here for right knee pain.  Patient denies known injury. Works out every day. Pain comes on during the day. Pain more medial. No catching, locking, giving out. Tried physical therapy, dry needling, rest, ice, advil without much benefit. Pain has been ongoing for 4 months. Radiographs she showed me today showed no DJD and were weight bearing. No prior issues with knees.  Past Medical History  Diagnosis Date  . Hypothyroidism     Current Outpatient Prescriptions on File Prior to Visit  Medication Sig Dispense Refill  . levothyroxine (SYNTHROID, LEVOTHROID) 100 MCG tablet Take 100 mcg by mouth daily.       No current facility-administered medications on file prior to visit.    Past Surgical History  Procedure Laterality Date  . Back surgery    . Hernia repair      No Known Allergies  Social History   Social History  . Marital Status: Married    Spouse Name: N/A  . Number of Children: N/A  . Years of Education: N/A   Occupational History  . Not on file.   Social History Main Topics  . Smoking status: Never Smoker   . Smokeless tobacco: Not on file  . Alcohol Use: Not on file  . Drug Use: Not on file  . Sexual Activity: Not on file   Other Topics Concern  . Not on file   Social History Narrative    Family History  Problem Relation Age of Onset  . Hyperlipidemia Father   . Hypertension Father   . Heart attack Maternal Grandfather   . Heart attack Paternal Grandmother   . Sudden death Neg Hx   . Diabetes Neg Hx     BP 115/75 mmHg  Pulse 76  Ht  (1.626 m)  Wt 153 lb (69.4 kg)  BMI 26.25 kg/m2  Review of Systems: See HPI above.    Objective:  Physical Exam:  Gen: NAD  Right knee: No gross deformity, ecchymoses, swelling. Mild medial joint line TTP. FROM. Negative ant/post drawers. Negative valgus/varus testing. Negative lachmanns. Negative mcmurrays, apleys,  patellar apprehension. NV intact distally.    Assessment & Plan:  1. Right knee pain - concerning for a degenerative meniscus tear.  Radiographs without DJD.  She has done extensive conservative treatment for at least 6 weeks with PT, dry needling, nsaids, rest.  Will go ahead with MRI to assess for meniscus tear.

## 2015-06-07 NOTE — Assessment & Plan Note (Signed)
concerning for a degenerative meniscus tear.  Radiographs without DJD.  She has done extensive conservative treatment for at least 6 weeks with PT, dry needling, nsaids, rest.  Will go ahead with MRI to assess for meniscus tear.

## 2015-06-08 ENCOUNTER — Other Ambulatory Visit: Payer: Self-pay | Admitting: Family Medicine

## 2015-06-08 DIAGNOSIS — S83241A Other tear of medial meniscus, current injury, right knee, initial encounter: Secondary | ICD-10-CM

## 2015-06-09 ENCOUNTER — Ambulatory Visit (HOSPITAL_BASED_OUTPATIENT_CLINIC_OR_DEPARTMENT_OTHER)
Admission: RE | Admit: 2015-06-09 | Discharge: 2015-06-09 | Disposition: A | Discharge status: Home / Self Care | Payer: BLUE CROSS/BLUE SHIELD | Source: Ambulatory Visit | Attending: Family Medicine | Admitting: Family Medicine

## 2015-06-09 ENCOUNTER — Ambulatory Visit (HOSPITAL_BASED_OUTPATIENT_CLINIC_OR_DEPARTMENT_OTHER): Payer: BLUE CROSS/BLUE SHIELD

## 2015-06-09 DIAGNOSIS — S83241A Other tear of medial meniscus, current injury, right knee, initial encounter: Secondary | ICD-10-CM | POA: Diagnosis not present

## 2015-06-09 DIAGNOSIS — X58XXXS Exposure to other specified factors, sequela: Secondary | ICD-10-CM | POA: Insufficient documentation

## 2015-06-09 DIAGNOSIS — M25461 Effusion, right knee: Secondary | ICD-10-CM | POA: Insufficient documentation

## 2015-06-12 ENCOUNTER — Ambulatory Visit (INDEPENDENT_AMBULATORY_CARE_PROVIDER_SITE_OTHER): Payer: BLUE CROSS/BLUE SHIELD | Admitting: Family Medicine

## 2015-06-12 DIAGNOSIS — M25561 Pain in right knee: Secondary | ICD-10-CM | POA: Diagnosis not present

## 2015-06-12 MED ORDER — METHYLPREDNISOLONE ACETATE 40 MG/ML IJ SUSP
40.0000 mg | Freq: Once | INTRAMUSCULAR | Status: AC
  Administered 2015-06-12: 40 mg via INTRA_ARTICULAR

## 2015-06-14 NOTE — Progress Notes (Signed)
PCP: Pcp Not In System  Subjective:   HPI: Patient is a 44 y.o. female here for right knee pain.  9/13: Patient denies known injury. Works out every day. Pain comes on during the day. Pain more medial. No catching, locking, giving out. Tried physical therapy, dry needling, rest, ice, advil without much benefit. Pain has been ongoing for 4 months. Radiographs she showed me today showed no DJD and were weight bearing. No prior issues with knees.  9/20: Patient returns for intraarticular injection.  Past Medical History  Diagnosis Date  . Hypothyroidism     Current Outpatient Prescriptions on File Prior to Visit  Medication Sig Dispense Refill  . levothyroxine (SYNTHROID, LEVOTHROID) 100 MCG tablet Take 100 mcg by mouth daily.      . Multiple Vitamin (MULTIVITAMIN) tablet Take 1 tablet by mouth.     No current facility-administered medications on file prior to visit.    Past Surgical History  Procedure Laterality Date  . Back surgery    . Hernia repair      No Known Allergies  Social History   Social History  . Marital Status: Married    Spouse Name: N/A  . Number of Children: N/A  . Years of Education: N/A   Occupational History  . Not on file.   Social History Main Topics  . Smoking status: Never Smoker   . Smokeless tobacco: Not on file  . Alcohol Use: Not on file  . Drug Use: Not on file  . Sexual Activity: Not on file   Other Topics Concern  . Not on file   Social History Narrative    Family History  Problem Relation Age of Onset  . Hyperlipidemia Father   . Hypertension Father   . Heart attack Maternal Grandfather   . Heart attack Paternal Grandmother   . Sudden death Neg Hx   . Diabetes Neg Hx     There were no vitals taken for this visit.  Review of Systems: See HPI above.    Objective:  Physical Exam:  Gen: NAD  Right knee: Mild effusion.  No bruising, other deformity. Mild medial joint line TTP. FROM. Negative ant/post  drawers. Negative valgus/varus testing. Negative lachmanns. Negative mcmurrays, apleys, patellar apprehension. NV intact distally.    Assessment & Plan:  1. Right knee pain - MRI consistent with full thickness chondral defect central aspect of lateral femoral condyle.  No evidence of meniscus tear, other findings to explain her symptoms.  We had discussed options and decided to trial an injection which was done today.  If not improving would consider ortho referral to discuss possible microfracture surgery.  After informed written consent patient was lying supine on exam table.  right knee was prepped with alcohol swab.  Utilizing superolateral approach with ultrasound guidance, 3 mL of marcaine was used for local anesthesia.  Then using an 18g needle on 60cc syringe, 9 mL of clear straw-colored fluid was aspirated from right knee.  Knee was then injected with 3:1 marcaine:depomedrol.  Patient tolerated procedure well without immediate complications

## 2015-06-14 NOTE — Assessment & Plan Note (Signed)
MRI consistent with full thickness chondral defect central aspect of lateral femoral condyle.  No evidence of meniscus tear, other findings to explain her symptoms.  We had discussed options and decided to trial an injection which was done today.  If not improving would consider ortho referral to discuss possible microfracture surgery.  After informed written consent patient was lying supine on exam table.  right knee was prepped with alcohol swab.  Utilizing superolateral approach with ultrasound guidance, 3 mL of marcaine was used for local anesthesia.  Then using an 18g needle on 60cc syringe, 9 mL of clear straw-colored fluid was aspirated from right knee.  Knee was then injected with 3:1 marcaine:depomedrol.  Patient tolerated procedure well without immediate complications

## 2015-06-28 NOTE — Addendum Note (Signed)
Addended by: Kathi Simpers F on: 06/28/2015 08:11 AM   Modules accepted: Orders

## 2016-04-14 ENCOUNTER — Encounter: Payer: BLUE CROSS/BLUE SHIELD | Admitting: Physical Therapy

## 2016-11-03 IMAGING — MR MR KNEE*R* W/O CM
6 series · 40 of 40 positions shown · non-contrast
Comparison: None.

CLINICAL DATA: Medial, lateral and posterior knee pain for 3 or 4
months with swelling, instability and limited range of motion.
Question medial meniscal tear. No previous relevant surgery. Initial
encounter.

EXAM:
MRI OF THE RIGHT KNEE WITHOUT CONTRAST
TECHNIQUE: Multiplanar, multisequence MR imaging of the knee was performed. No
intravenous contrast was administered.

[Series 3: PD fat-sat · axial · 4.0mm · 0.50mm/px · z∈[-70,+40]mm · 7 of 23 slices shown (1 of 3)]
[im 1/23]
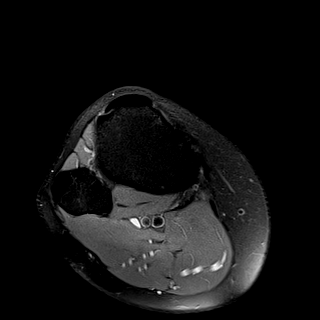
[im 4/23]
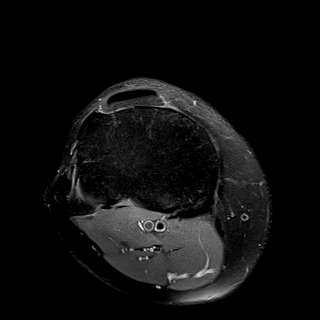
[im 8/23]
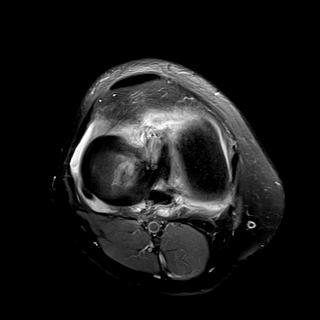
[im 12/23]
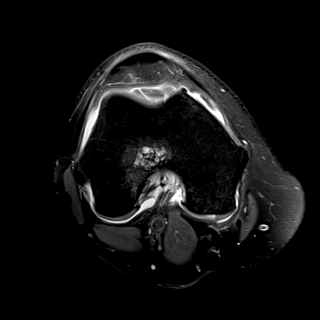
[im 15/23]
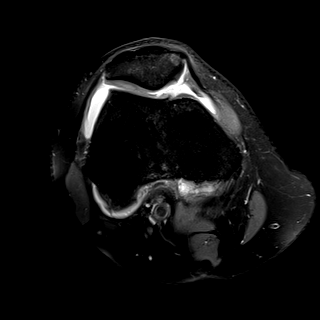
[im 19/23]
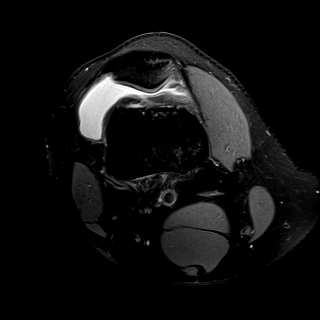
[im 23/23]
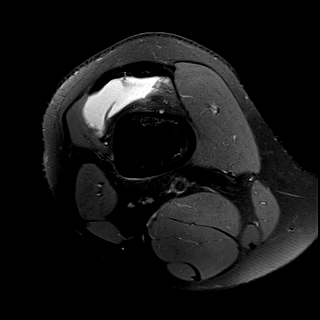

[Series 4: PD fat-sat · coronal · 4.0mm · 0.50mm/px · 7 of 23 slices shown (2 of 3)]
[im 1/23]
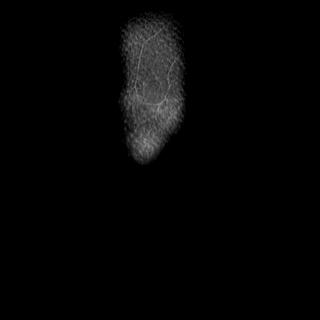
[im 4/23]
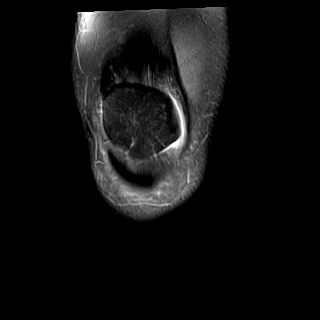
[im 8/23]
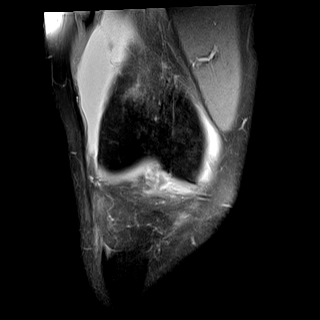
[im 12/23]
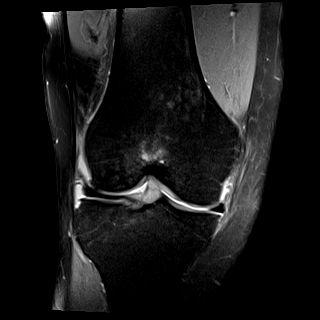
[im 15/23]
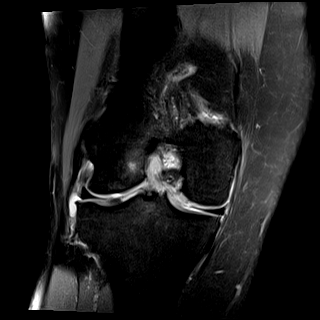
[im 19/23]
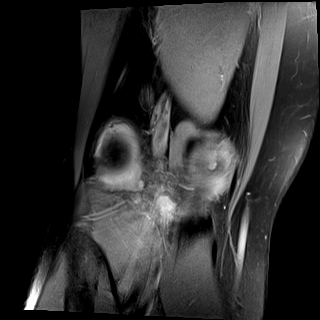
[im 23/23]
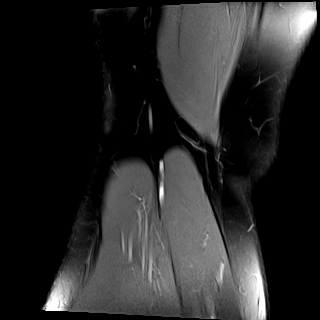

[Series 5: T2 fat-sat · coronal · 4.0mm · 0.50mm/px · 7 of 23 slices shown]
[im 1/23]
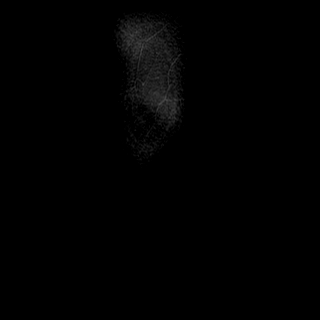
[im 4/23]
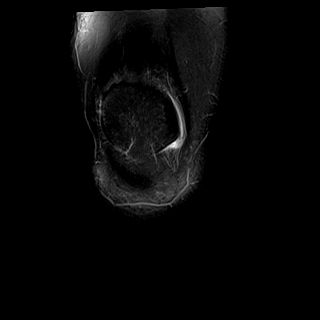
[im 8/23]
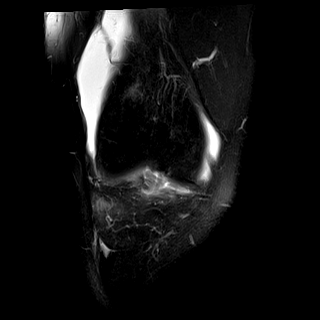
[im 12/23]
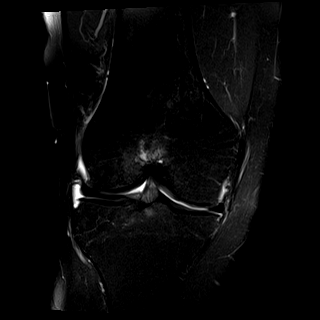
[im 15/23]
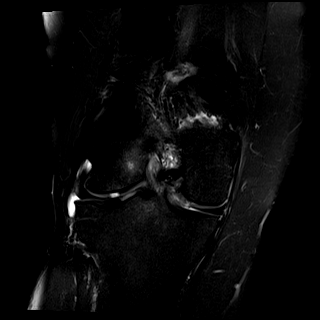
[im 19/23]
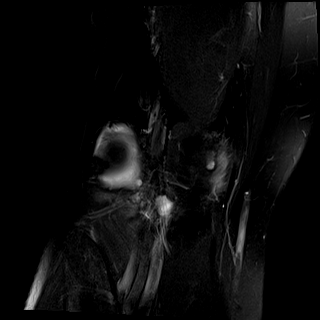
[im 23/23]
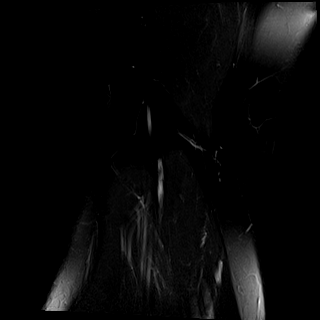

[Series 6: PD fat-sat · sagittal · 4.0mm · 0.50mm/px · 7 of 24 slices shown (3 of 3)]
[im 1/24]
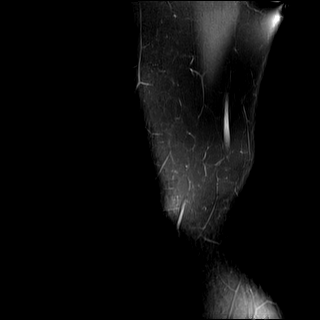
[im 4/24]
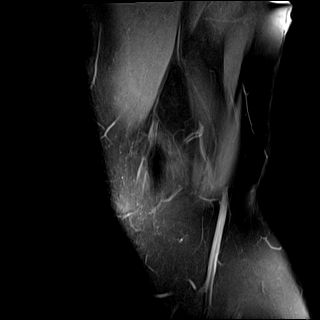
[im 8/24]
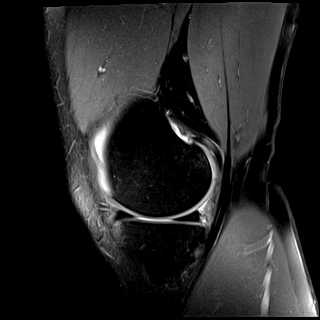
[im 12/24]
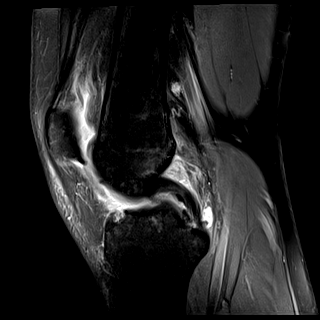
[im 16/24]
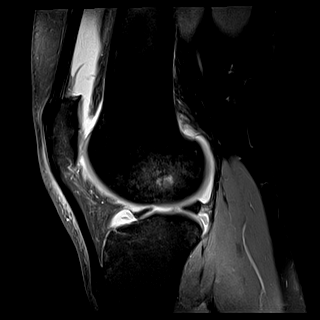
[im 20/24]
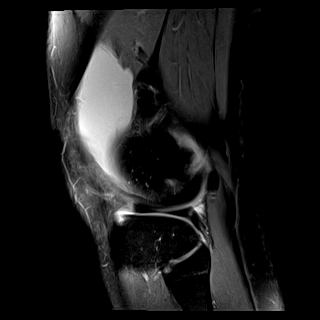
[im 24/24]
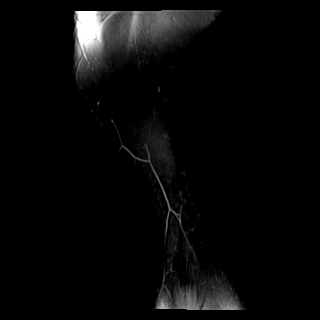

[Series 7: T1 · coronal · 4.0mm · 0.62mm/px · 7 of 23 slices shown]
[im 1/23]
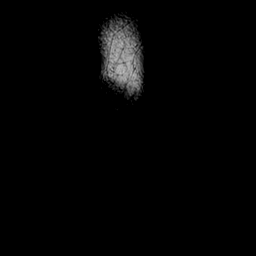
[im 4/23]
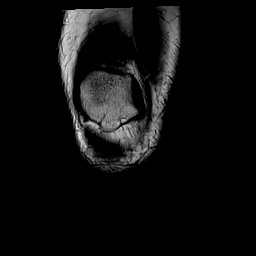
[im 8/23]
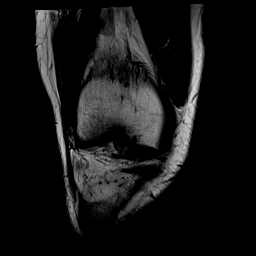
[im 12/23]
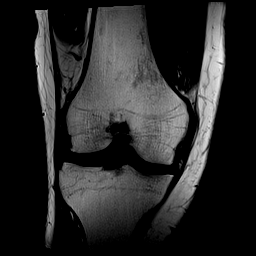
[im 15/23]
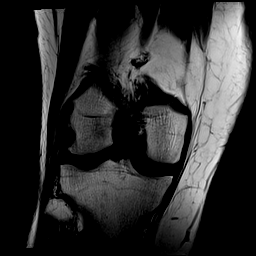
[im 19/23]
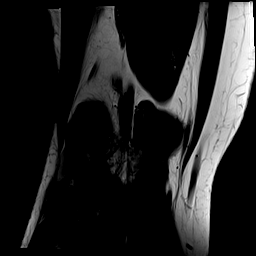
[im 23/23]
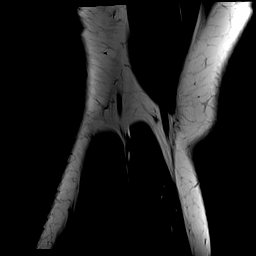

[Series 8: PD · oblique · 2.0mm · 0.50mm/px · 5 of 15 slices shown]
[im 1/15]
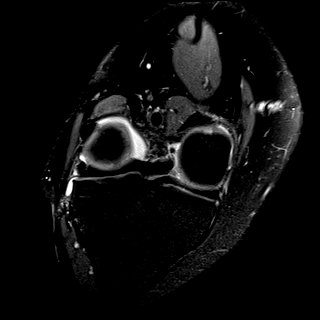
[im 4/15]
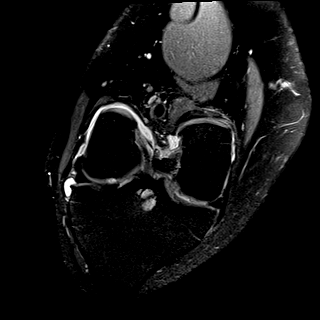
[im 8/15]
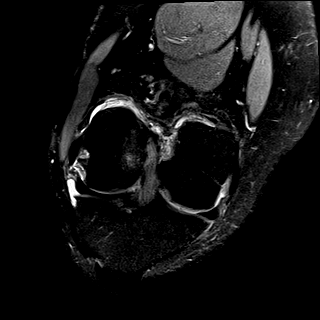
[im 11/15]
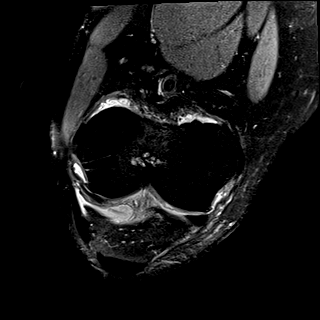
[im 15/15]
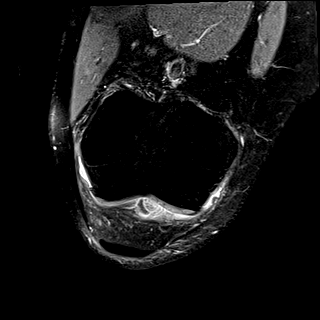

[40 of 40 positions shown; findings below may reference images not displayed]

FINDINGS: MENISCI

Medial meniscus:  Intact with normal morphology.

Lateral meniscus:  Intact with normal morphology.

LIGAMENTS

Cruciates: There is moderate ACL mucoid degeneration with
intercondylar notch cyst formation. In addition, there is
intraosseous cyst formation and surrounding edema in the distal
femur and proximal tibia adjacent to the ACL. The PCL appears
normal.

Collaterals:  Intact.

CARTILAGE

Patellofemoral: Mild patellofemoral chondral thinning and surface
irregularity without subchondral signal abnormality.

Medial: Moderate chondral surface irregularity and thinning without
full-thickness defect or subchondral signal abnormality.

Lateral: There is a fairly large full-thickness chondral defect
involving the central aspect of the lateral femoral condyle, best
seen on coronal images 8 through 11. There is no subchondral signal
abnormality in this area. The tibial cartilage is also thinned.

OTHER

Joint:  Moderate-sized joint effusion.  No loose bodies observed.

Popliteal Fossa:  Unremarkable. No significant Baker's cyst.

Extensor Mechanism:  Intact.

Bones:  No significant extra-articular osseous findings.
IMPRESSION: 1. Prominent ACL mucoid degeneration with intercondylar notch and
intraosseous cyst formation.
2. Tricompartmental degenerative chondrosis, with full thickness
chondral defect over the central aspect of the lateral femoral
condyle.
3. Moderate-sized joint effusion.
4. No evidence of meniscal tear.

## 2017-02-27 ENCOUNTER — Encounter: Payer: Self-pay | Admitting: Family Medicine

## 2017-02-27 ENCOUNTER — Ambulatory Visit (INDEPENDENT_AMBULATORY_CARE_PROVIDER_SITE_OTHER): Payer: BLUE CROSS/BLUE SHIELD | Admitting: Family Medicine

## 2017-02-27 VITALS — BP 120/83 | HR 67 | Ht 64.0 in | Wt 160.0 lb

## 2017-02-27 DIAGNOSIS — G8929 Other chronic pain: Secondary | ICD-10-CM | POA: Diagnosis not present

## 2017-02-27 DIAGNOSIS — M25561 Pain in right knee: Secondary | ICD-10-CM | POA: Diagnosis not present

## 2017-02-27 MED ORDER — METHYLPREDNISOLONE ACETATE 40 MG/ML IJ SUSP
40.0000 mg | Freq: Once | INTRAMUSCULAR | Status: AC
  Administered 2017-02-27: 40 mg via INTRA_ARTICULAR

## 2017-02-27 NOTE — Patient Instructions (Signed)
Your pain and swelling are due to arthritis. These are the different medications you can take for this: Tylenol 500mg  1-2 tabs three times a day for pain. Capsaicin, aspercreme, or biofreeze topically up to four times a day may also help with pain. Some supplements that may help for arthritis: Boswellia extract, curcumin, pycnogenol Cortisone injections are an option - you were given this today. If cortisone injections do not help, there are different types of shots that may help but they take longer to take effect. It's important that you continue to stay active. Straight leg raises, knee extensions 3 sets of 10 once a day (add ankle weight if these become too easy). Consider physical therapy to strengthen muscles around the joint that hurts to take pressure off of the joint itself. Shoe inserts with good arch support may be helpful. Heat or ice 15 minutes at a time 3-4 times a day as needed to help with pain. Water aerobics and cycling with low resistance are the best two types of exercise for arthritis. Follow up with me in 1 month but call me sooner if you're struggling.

## 2017-03-02 NOTE — Assessment & Plan Note (Signed)
s/p debridement surgery 1 1/2 years ago.  Discussed likelihood of advancing arthritis given lack of new injury, effusion.  We discussed tylenol, topical medications, supplements that may help.  Aspiration and injection given today.  Shown home exercises to do daily.  Icing.  F/u in 1 month.  After informed written consent patient was lying supine on exam table.  Right knee was prepped with alcohol swab.  Utilizing superolateral approach with ultrasound guidance, 3 mL of bupivicaine was used for local anesthesia.  Then using an 18g needle on 60cc syringe, 19 mL of clear straw-colored fluid was aspirated from right knee.  Knee was then injected with 3:1 bupivicaine:depomedrol.  Patient tolerated procedure well without immediate complications.

## 2017-03-02 NOTE — Progress Notes (Signed)
PCP: System, Pcp Not In  Subjective:   HPI: Patient is a 46 y.o. female here for right knee pain.  9/13: Patient denies known injury. Works out every day. Pain comes on during the day. Pain more medial. No catching, locking, giving out. Tried physical therapy, dry needling, rest, ice, advil without much benefit. Pain has been ongoing for 4 months. Radiographs she showed me today showed no DJD and were weight bearing. No prior issues with knees.  06/12/15: Patient returns for intraarticular injection.  02/27/17: Patient returns with recurrence of pain right knee. She reports having had debridement but not microfracture surgery. Surgery was back in October 2016. Pain recurred especially past 2 weeks but started swelling back this winter. Pain is worse with walking. Pain medial, lateral to posterior. Tried tylenol, icing, deep blue. Pain 1/10 at rest but up to 5/10 and sharp with walking. She is planning to donate her kidney to her sister so avoiding anti-inflammatories. No skin changes, numbness.  Past Medical History:  Diagnosis Date  . Hypothyroidism     Current Outpatient Prescriptions on File Prior to Visit  Medication Sig Dispense Refill  . levothyroxine (SYNTHROID, LEVOTHROID) 100 MCG tablet Take 100 mcg by mouth daily.      . Multiple Vitamin (MULTIVITAMIN) tablet Take 1 tablet by mouth.     No current facility-administered medications on file prior to visit.     Past Surgical History:  Procedure Laterality Date  . BACK SURGERY    . HERNIA REPAIR      No Known Allergies  Social History   Social History  . Marital status: Married    Spouse name: N/A  . Number of children: N/A  . Years of education: N/A   Occupational History  . Not on file.   Social History Main Topics  . Smoking status: Never Smoker  . Smokeless tobacco: Never Used  . Alcohol use Not on file  . Drug use: Unknown  . Sexual activity: Not on file   Other Topics Concern  . Not on  file   Social History Narrative  . No narrative on file    Family History  Problem Relation Age of Onset  . Hyperlipidemia Father   . Hypertension Father   . Heart attack Maternal Grandfather   . Heart attack Paternal Grandmother   . Sudden death Neg Hx   . Diabetes Neg Hx     BP 120/83   Pulse 67   Ht 5\' 4"  (1.626 m)   Wt 160 lb (72.6 kg)   BMI 27.46 kg/m   Review of Systems: See HPI above.    Objective:  Physical Exam:  Gen: NAD  Right knee: Mod effusion.  No bruising, other deformity. Medial, lateral joint line and post patellar facet mild tenderness. FROM. Negative ant/post drawers. Negative valgus/varus testing. Negative lachmanns. Negative mcmurrays, apleys, patellar apprehension. NV intact distally.    Assessment & Plan:  1. Right knee pain - s/p debridement surgery 1 1/2 years ago.  Discussed likelihood of advancing arthritis given lack of new injury, effusion.  We discussed tylenol, topical medications, supplements that may help.  Aspiration and injection given today.  Shown home exercises to do daily.  Icing.  F/u in 1 month.  After informed written consent patient was lying supine on exam table.  Right knee was prepped with alcohol swab.  Utilizing superolateral approach with ultrasound guidance, 3 mL of bupivicaine was used for local anesthesia.  Then using an 18g needle on 60cc  syringe, 19 mL of clear straw-colored fluid was aspirated from right knee.  Knee was then injected with 3:1 bupivicaine:depomedrol.  Patient tolerated procedure well without immediate complications.

## 2017-06-09 ENCOUNTER — Encounter: Payer: Self-pay | Admitting: Family Medicine

## 2017-06-09 ENCOUNTER — Ambulatory Visit (INDEPENDENT_AMBULATORY_CARE_PROVIDER_SITE_OTHER): Payer: BLUE CROSS/BLUE SHIELD | Admitting: Family Medicine

## 2017-06-09 DIAGNOSIS — G8929 Other chronic pain: Secondary | ICD-10-CM

## 2017-06-09 DIAGNOSIS — M25561 Pain in right knee: Secondary | ICD-10-CM

## 2017-06-09 MED ORDER — METHYLPREDNISOLONE ACETATE 40 MG/ML IJ SUSP
40.0000 mg | Freq: Once | INTRAMUSCULAR | Status: AC
  Administered 2017-06-09: 40 mg via INTRA_ARTICULAR

## 2017-06-09 NOTE — Patient Instructions (Signed)
Your pain and swelling are due to arthritis. These are the different medications you can take for this: Tylenol  1-2 tabs three times a day for pain. Capsaicin, aspercreme, or biofreeze topically up to four times a day may also help with pain. Some supplements that may help for arthritis: Boswellia extract, curcumin, pycnogenol Cortisone injections are an option - you were given this today. If cortisone injections do not help, there are different types of shots that may help but they take longer to take effect. It's important that you continue to stay active. Straight leg raises, knee extensions 3 sets of 10 once a day (add ankle weight if these become too easy). Consider physical therapy to strengthen muscles around the joint that hurts to take pressure off of the joint itself. Shoe inserts with good arch support may be helpful. Heat or ice 15 minutes at a time 3-4 times a day as needed to help with pain. Water aerobics and cycling with low resistance are the best two types of exercise for arthritis. Follow up with me as needed.

## 2017-06-10 NOTE — Assessment & Plan Note (Signed)
s/p debridement surgery ~2 years ago.  Noted a lot of synovitis on today's MSK u/s along with a mild effusion.  Went ahead with injection today with ultrasound guidance - not enough fluid to warrant aspiration by u/s.  We discussed tylenol, topical medications, supplements that may help.  HEP, icing.  F/u prn.  After informed written consent timeout was performed, patient was lying supine on exam table.  Right knee was prepped with alcohol swab.  Utilizing superolateral approach with ultrasound guidance, right knee was injected with 3:1 bupivicaine:depomedrol.  Patient tolerated procedure well without immediate complications.

## 2017-06-10 NOTE — Progress Notes (Signed)
PCP: System, Pcp Not In  Subjective:   HPI: Patient is a 46 y.o. female here for right knee pain.  9/13: Patient denies known injury. Works out every day. Pain comes on during the day. Pain more medial. No catching, locking, giving out. Tried physical therapy, dry needling, rest, ice, advil without much benefit. Pain has been ongoing for 4 months. Radiographs she showed me today showed no DJD and were weight bearing. No prior issues with knees.  06/12/15: Patient returns for intraarticular injection.  02/27/17: Patient returns with recurrence of pain right knee. She reports having had debridement but not microfracture surgery. Surgery was back in October 2016. Pain recurred especially past 2 weeks but started swelling back this winter. Pain is worse with walking. Pain medial, lateral to posterior. Tried tylenol, icing, deep blue. Pain 1/10 at rest but up to 5/10 and sharp with walking. She is planning to donate her kidney to her sister so avoiding anti-inflammatories. No skin changes, numbness.  9/18: Patient reports over past several days her right knee has started swelling again and was very big around Sunday. She is doing more band exercises and jogging around the building but nothing out of the ordinary. No new injuries. Difficulty bending her knee due to this. Pain level now 0/10, was more of a stiffness anteriorly. Worse with walking. No skin changes, numbness.  Past Medical History:  Diagnosis Date  . Hypothyroidism     Current Outpatient Prescriptions on File Prior to Visit  Medication Sig Dispense Refill  . levothyroxine (SYNTHROID, LEVOTHROID) 100 MCG tablet Take 100 mcg by mouth daily.      . Multiple Vitamin (MULTIVITAMIN) tablet Take 1 tablet by mouth.     No current facility-administered medications on file prior to visit.     Past Surgical History:  Procedure Laterality Date  . BACK SURGERY    . HERNIA REPAIR      Allergies  Allergen Reactions   . Ibuprofen   . Nsaids     Social History   Social History  . Marital status: Married    Spouse name: N/A  . Number of children: N/A  . Years of education: N/A   Occupational History  . Not on file.   Social History Main Topics  . Smoking status: Never Smoker  . Smokeless tobacco: Never Used  . Alcohol use Not on file  . Drug use: Unknown  . Sexual activity: Not on file   Other Topics Concern  . Not on file   Social History Narrative  . No narrative on file    Family History  Problem Relation Age of Onset  . Hyperlipidemia Father   . Hypertension Father   . Heart attack Maternal Grandfather   . Heart attack Paternal Grandmother   . Sudden death Neg Hx   . Diabetes Neg Hx     BP (!) 145/81   Pulse 60   Ht  (1.626 m)   Wt 160 lb (72.6 kg)   BMI 27.46 kg/m   Review of Systems: See HPI above.    Objective:  Physical Exam:  Gen: NAD  Right knee: Mod effusion.  No bruising, other deformity. Very mild Medial, lateral joint line and post patellar facet tenderness. FROM with pain on full flexion. Negative ant/post drawers. Negative valgus/varus testing. Negative lachmanns. Negative mcmurrays, apleys, patellar apprehension. NV intact distally.    Assessment & Plan:  1. Right knee pain - s/p debridement surgery ~2 years ago.  Noted a lot  of synovitis on today's MSK u/s along with a mild effusion.  Went ahead with injection today with ultrasound guidance - not enough fluid to warrant aspiration by u/s.  We discussed tylenol, topical medications, supplements that may help.  HEP, icing.  F/u prn.  After informed written consent timeout was performed, patient was lying supine on exam table.  Right knee was prepped with alcohol swab.  Utilizing superolateral approach with ultrasound guidance, right knee was injected with 3:1 bupivicaine:depomedrol.  Patient tolerated procedure well without immediate complications.
